# Patient Record
Sex: Male | Born: 1990 | Race: Black or African American | Hispanic: No | Marital: Single | Smoking: Current every day smoker
Health system: Southern US, Community
[De-identification: ages and names within clinical notes are randomized; demographics above are authoritative.]

---

## 2017-04-27 ENCOUNTER — Encounter (HOSPITAL_COMMUNITY): Payer: Self-pay | Admitting: Emergency Medicine

## 2017-04-27 ENCOUNTER — Emergency Department (HOSPITAL_COMMUNITY)
Admission: EM | Admit: 2017-04-27 | Discharge: 2017-04-27 | Disposition: A | Discharge status: Home / Self Care | Payer: Self-pay | Attending: Emergency Medicine | Admitting: Emergency Medicine

## 2017-04-27 ENCOUNTER — Other Ambulatory Visit: Payer: Self-pay

## 2017-04-27 DIAGNOSIS — R21 Rash and other nonspecific skin eruption: Secondary | ICD-10-CM | POA: Insufficient documentation

## 2017-04-27 LAB — RPR: RPR Ser Ql: NONREACTIVE

## 2017-04-27 LAB — HIV ANTIBODY (ROUTINE TESTING W REFLEX): HIV Screen 4th Generation wRfx: NONREACTIVE

## 2017-04-27 NOTE — ED Triage Notes (Signed)
Pt arriving with rash on his penis of unknown source. Pt states he noticed it a couple days ago but the area has since scabbed over.

## 2017-04-27 NOTE — ED Provider Notes (Signed)
[facility]-EMERGENCY DEPT Provider Note   CSN: [id number] Arrival date & time: 04/27/17  0551     History   Chief Complaint Chief Complaint  Patient presents with  . Rash    HPI Jontrell Zain Lankford is a 27 y.o. male.  HPI  27 year old male presents with a rash noted to his penis.  He states he first noticed it a couple days ago but is not exactly sure how long is been there.  At first it might of had a slight burn to it but then otherwise has been painless.  He has not noticed it changing in shape or size.  No dysuria, abdominal pain, or penile discharge.  No testicular pain or swelling.  History reviewed. No pertinent past medical history.  There are no active problems to display for this patient.   History reviewed. No pertinent surgical history.     Home Medications    Prior to Admission medications   Not on File    Family History No family history on file.  Social History Social History   Tobacco Use  . Smoking status: Not on file  Substance Use Topics  . Alcohol use: Not on file  . Drug use: Not on file     Allergies   Patient has no known allergies.   Review of Systems Review of Systems  Gastrointestinal: Negative for abdominal pain.  Genitourinary: Negative for discharge, penile pain, penile swelling, scrotal swelling and testicular pain.  Skin: Positive for rash.  All other systems reviewed and are negative.    Physical Exam Updated Vital Signs BP 118/75 (BP Location: Left Arm)   Pulse (!) 105   Temp 98.6 F (37 C) (Oral)   Resp 18   SpO2 99%   Physical Exam  Constitutional: He is oriented to person, place, and time. He appears well-developed and well-nourished.  HENT:  Head: Normocephalic and atraumatic.  Right Ear: External ear normal.  Left Ear: External ear normal.  Nose: Nose normal.  Eyes: Right eye exhibits no discharge. Left eye exhibits no discharge.  Pulmonary/Chest: Effort normal.    Abdominal: Soft. He exhibits no distension. There is no tenderness.  Genitourinary: Right testis shows no swelling and no tenderness. Left testis shows no swelling and no tenderness. No penile erythema or penile tenderness. No discharge found.     Musculoskeletal: He exhibits no edema.  Neurological: He is alert and oriented to person, place, and time.  Skin: Skin is warm and dry. Rash noted.  Nursing note and vitals reviewed.    ED Treatments / Results  Labs (all labs ordered are listed, but only abnormal results are displayed) Labs Reviewed  RPR  HIV ANTIBODY (ROUTINE TESTING)  GC/CHLAMYDIA PROBE AMP ([place]) NOT AT [facility]    EKG  EKG Interpretation None       Radiology No results found.  Procedures Procedures (including critical care time)  Medications Ordered in ED Medications - No data to display   Initial Impression / Assessment and Plan / ED Course  I have reviewed the triage vital signs and the nursing notes.  Pertinent labs & imaging results that were available during my care of the patient were reviewed by me and considered in my medical decision making (see chart for details).     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  He does not have any other obvious STI symptoms.  He has otherwise not had  any fever or other acute illness.  No discharge.  He will be tested for STI and counseled not to have intercourse until these test results of come back.  HIV, RPR, and GC/chlamydia will be sent.  Otherwise, discussed following up with the health department and that given the lesion is not growing it is okay to wait at this time.  If his symptoms seem to be worsening or lesion is becoming larger, return for evaluation.  Final Clinical Impressions(s) / ED Diagnoses   Final diagnoses:  Penile rash    ED Discharge Orders    None       Pricilla LovelessGoldston, Ramona Slinger, MD 04/27/17 [phone]

## 2017-04-27 NOTE — Discharge Instructions (Signed)
Your are being tested for STDs by blood and urine. These can take 48 hours or more to come back. If you do not hear from us in 72 hours, call for your test results. Otherwise follow up with the health department. Do not have intercourse until you know these results are negative, and even then make sure you use protection

## 2017-04-30 LAB — GC/CHLAMYDIA PROBE AMP (~~LOC~~) NOT AT ARMC
CHLAMYDIA, DNA PROBE: NEGATIVE
NEISSERIA GONORRHEA: NEGATIVE

## 2020-01-07 ENCOUNTER — Emergency Department
Admission: EM | Admit: 2020-01-07 | Discharge: 2020-01-07 | Disposition: A | Discharge status: Home / Self Care | Payer: 59 | Attending: Emergency Medicine | Admitting: Emergency Medicine

## 2020-01-07 ENCOUNTER — Emergency Department: Payer: 59

## 2020-01-07 ENCOUNTER — Other Ambulatory Visit: Payer: Self-pay

## 2020-01-07 ENCOUNTER — Encounter: Payer: Self-pay | Admitting: Emergency Medicine

## 2020-01-07 DIAGNOSIS — R1031 Right lower quadrant pain: Secondary | ICD-10-CM | POA: Diagnosis present

## 2020-01-07 DIAGNOSIS — F17200 Nicotine dependence, unspecified, uncomplicated: Secondary | ICD-10-CM | POA: Insufficient documentation

## 2020-01-07 LAB — COMPREHENSIVE METABOLIC PANEL
ALT: 11 U/L (ref 0–44)
AST: 16 U/L (ref 15–41)
Albumin: 4.3 g/dL (ref 3.5–5.0)
Alkaline Phosphatase: 65 U/L (ref 38–126)
Anion gap: 8 (ref 5–15)
BUN: 13 mg/dL (ref 6–20)
CO2: 27 mmol/L (ref 22–32)
Calcium: 9.6 mg/dL (ref 8.9–10.3)
Chloride: 105 mmol/L (ref 98–111)
Creatinine, Ser: 0.98 mg/dL (ref 0.61–1.24)
GFR, Estimated: >60 mL/min (ref >60)
Glucose, Bld: 115 mg/dL — ABNORMAL HIGH (ref 70–99)
Potassium: 3.4 mmol/L — ABNORMAL LOW (ref 3.5–5.1)
Sodium: 140 mmol/L (ref 135–145)
Total Bilirubin: 1.7 mg/dL — ABNORMAL HIGH (ref 0.3–1.2)
Total Protein: 7.6 g/dL (ref 6.5–8.1)

## 2020-01-07 LAB — URINALYSIS, COMPLETE (UACMP) WITH MICROSCOPIC
Bacteria, UA: NONE SEEN
Bilirubin Urine: NEGATIVE
Glucose, UA: NEGATIVE mg/dL
Ketones, ur: NEGATIVE mg/dL
Leukocytes,Ua: NEGATIVE
Nitrite: NEGATIVE
Protein, ur: NEGATIVE mg/dL
Specific Gravity, Urine: >1.046 — ABNORMAL HIGH (ref 1.005–1.030)
pH: 6 (ref 5.0–8.0)

## 2020-01-07 LAB — CBC
HCT: 37.7 % — ABNORMAL LOW (ref 39.0–52.0)
Hemoglobin: 13.4 g/dL (ref 13.0–17.0)
MCH: 33.8 pg (ref 26.0–34.0)
MCHC: 35.5 g/dL (ref 30.0–36.0)
MCV: 95.2 fL (ref 80.0–100.0)
Platelets: 187 10*3/uL (ref 150–400)
RBC: 3.96 MIL/uL — ABNORMAL LOW (ref 4.22–5.81)
RDW: 11.6 % (ref 11.5–15.5)
WBC: 8.4 10*3/uL (ref 4.0–10.5)
nRBC: 0 % (ref 0.0–0.2)

## 2020-01-07 LAB — HIV ANTIBODY (ROUTINE TESTING W REFLEX): HIV Screen 4th Generation wRfx: NONREACTIVE

## 2020-01-07 LAB — CHLAMYDIA/NGC RT PCR (ARMC ONLY)
Chlamydia Tr: DETECTED — AB
N gonorrhoeae: NOT DETECTED

## 2020-01-07 IMAGING — CT CT ABD-PELV W/ CM
2 of 4 series · 15 of 46 positions shown, 17 images · IV contrast (APPLIED)
Comparison: None.

CLINICAL DATA: Right-sided groin pain with hard tender palpable
knot.

EXAM:
CT ABDOMEN AND PELVIS WITH CONTRAST
TECHNIQUE: Multidetector CT imaging of the abdomen and pelvis was performed
using the standard protocol following bolus administration of
intravenous contrast.
CONTRAST:  75mL OMNIPAQUE IOHEXOL 300 MG/ML  SOLN

[Series 2: routine abd/pel with · axial · 0.64mm/px · z∈[-526,-106]mm · 12 of 96 slices shown, 14 images]
[im 8/96  soft-tissue]
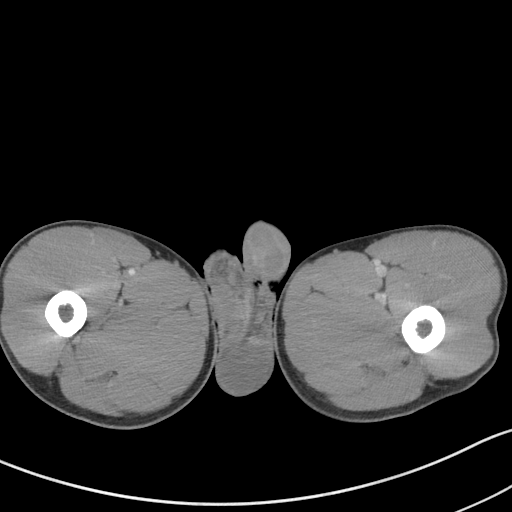
[im 8/96  bone]
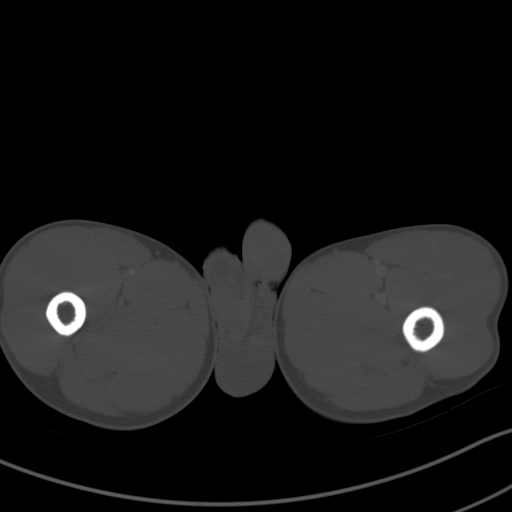
[im 16/96  soft-tissue]
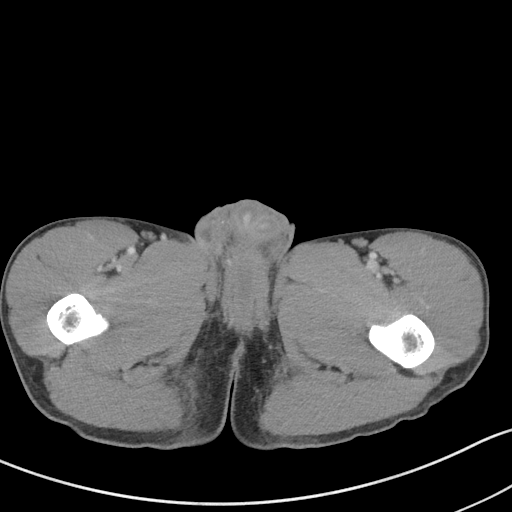
[im 23/96  soft-tissue]
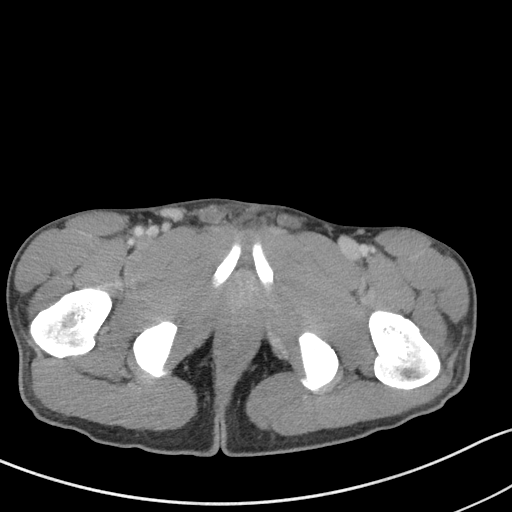
[im 31/96  soft-tissue]
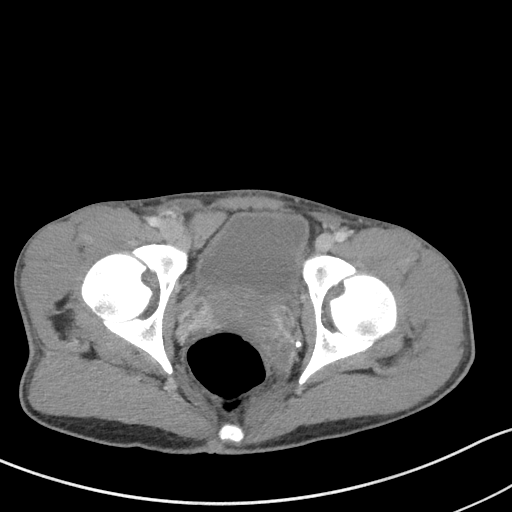
[im 39/96  soft-tissue]
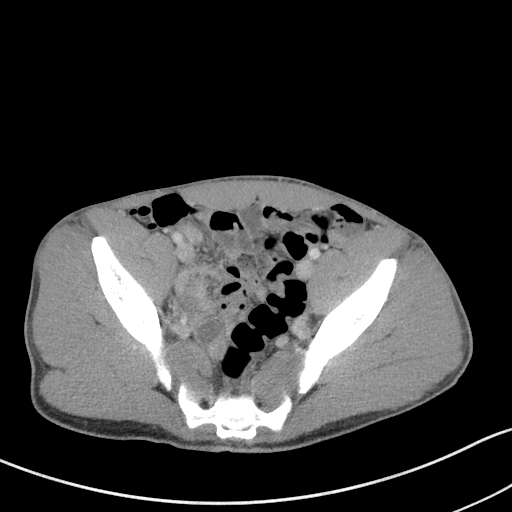
[im 46/96  soft-tissue]
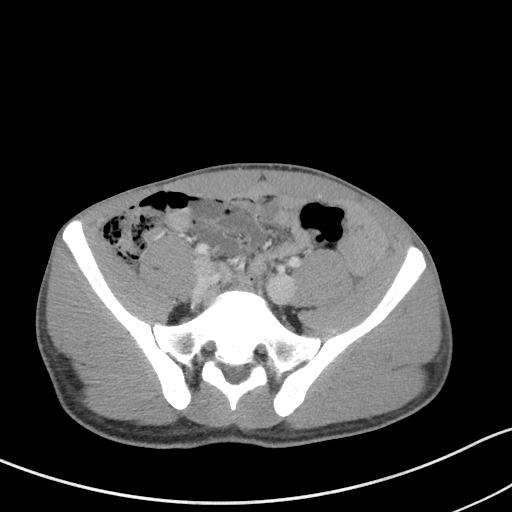
[im 54/96  soft-tissue]
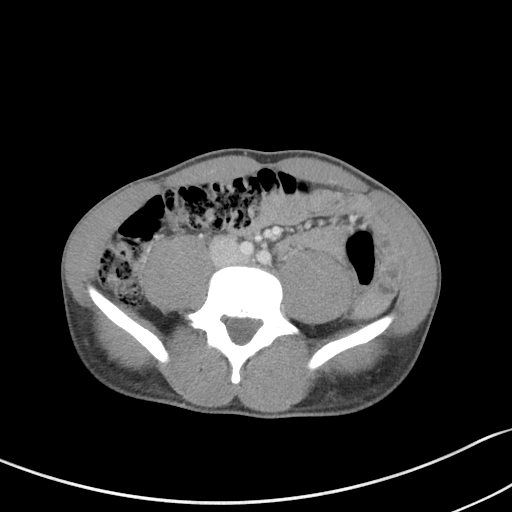
[im 61/96  soft-tissue]
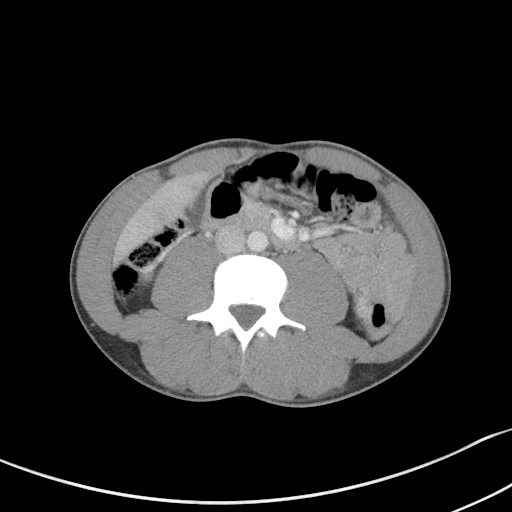
[im 69/96  soft-tissue]
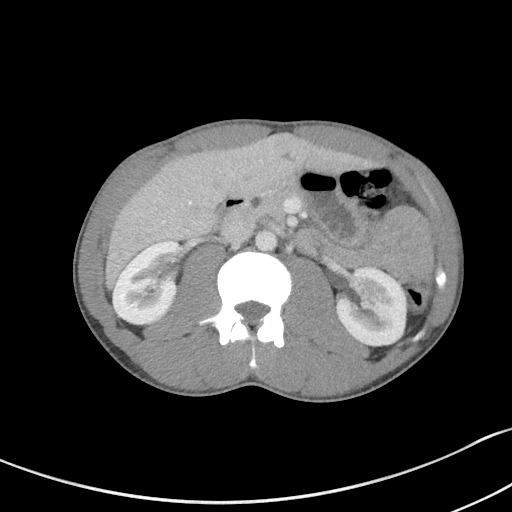
[im 69/96  bone]
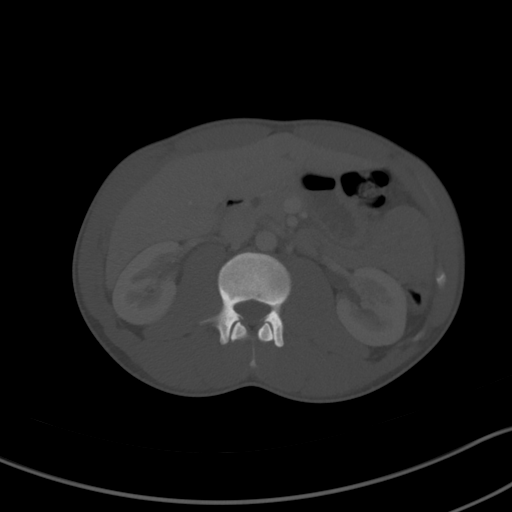
[im 77/96  soft-tissue]
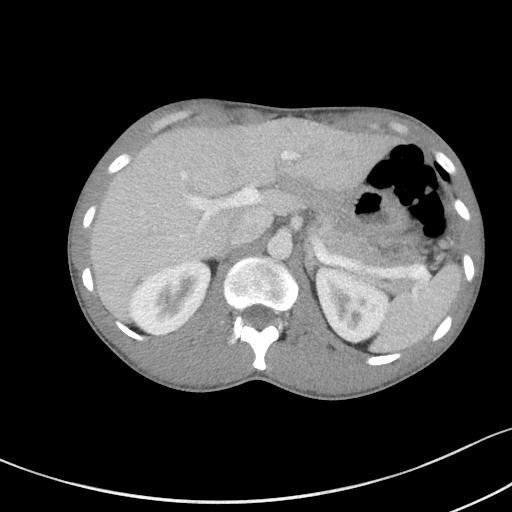
[im 84/96  soft-tissue]
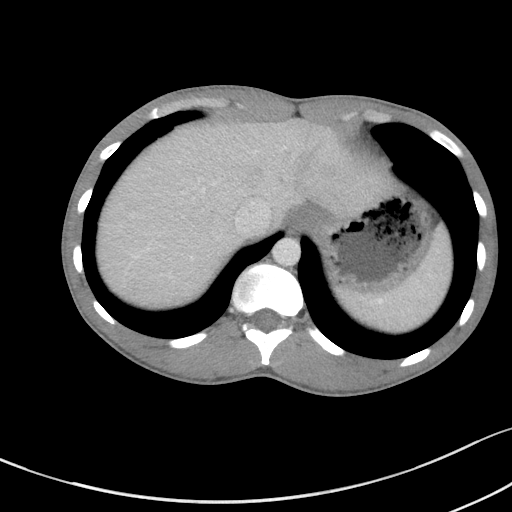
[im 92/96  soft-tissue]
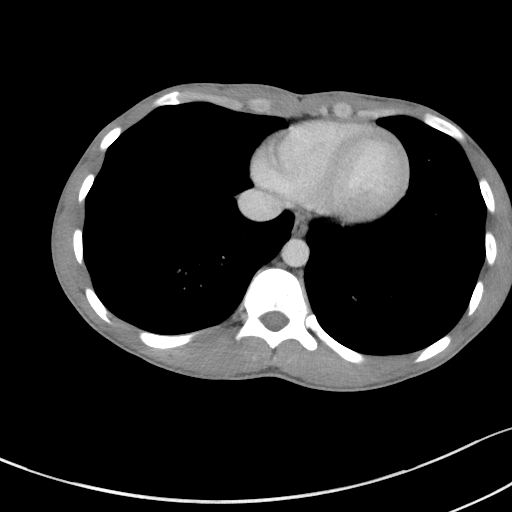

[Series 5: coronal st · coronal · 0.66mm/px · 3 of 81 slices shown]
[im 27/81  soft-tissue]
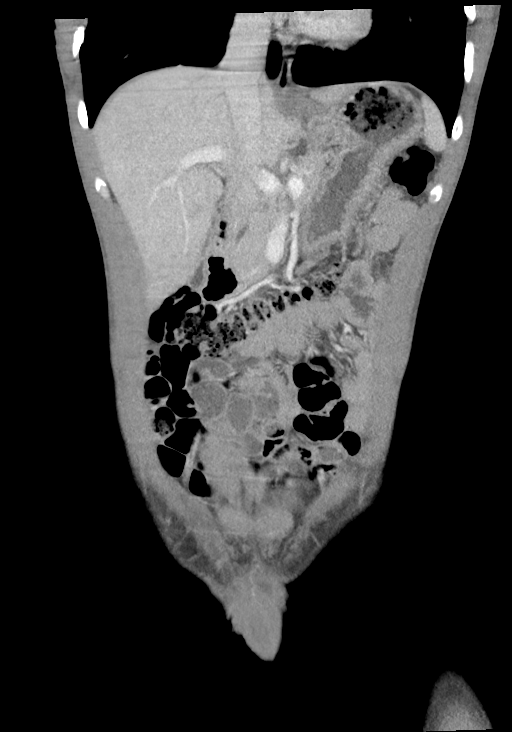
[im 36/81  soft-tissue]
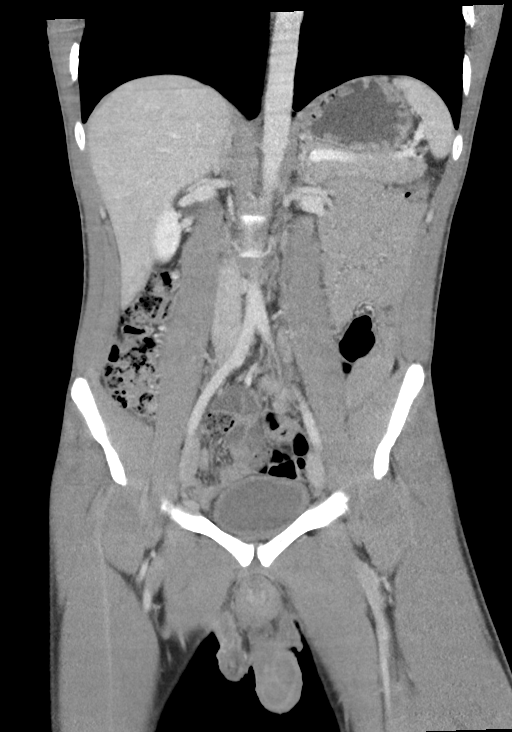
[im 45/81  soft-tissue]
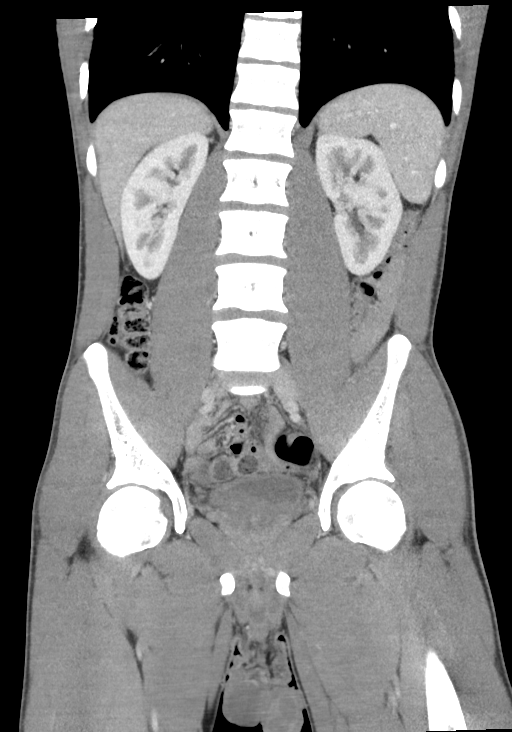

[15 of 46 positions shown; findings below may reference images not displayed]

FINDINGS: Lower chest: Unremarkable.

Hepatobiliary: No suspicious focal abnormality within the liver
parenchyma. Gallbladder is nondistended. No intrahepatic or
extrahepatic biliary dilation.

Pancreas: No focal mass lesion. No dilatation of the main duct. No
intraparenchymal cyst. No peripancreatic edema.

Spleen: No splenomegaly. No focal mass lesion.

Adrenals/Urinary Tract: No adrenal nodule or mass. Kidneys
unremarkable. No evidence for hydroureter. The urinary bladder
appears normal for the degree of distention.

Stomach/Bowel: Stomach is unremarkable. No gastric wall thickening.
No evidence of outlet obstruction. Duodenum is normally positioned
as is the ligament of Treitz. No small bowel wall thickening. No
small bowel dilatation. The terminal ileum is normal. The appendix
is normal. No gross colonic mass. No colonic wall thickening.

Vascular/Lymphatic: No abdominal aortic aneurysm. No abdominal
aortic atherosclerotic calcification. There is no gastrohepatic or
hepatoduodenal ligament lymphadenopathy. No retroperitoneal or
mesenteric lymphadenopathy. No pelvic sidewall lymphadenopathy.

Reproductive: The prostate gland and seminal vesicles are
unremarkable.

Other: No intraperitoneal free fluid.

Musculoskeletal: No evidence for groin hernia. Subtle asymmetry of
groin lymph nodes evident with right-sided groin nodes measuring up
to 5 mm short axis. Subtle asymmetric skin thickening noted in the
right groin region tracking into the right aspect of the scrotum. No
worrisome lytic or sclerotic osseous abnormality.
IMPRESSION: 1. No right groin hernia. No right groin mass. Normal sized lymph
nodes in the right groin region are asymmetrically larger than the
left. There is also subtle asymmetric skin thickening and
subcutaneous edema in the right groin region tracking into the right
aspect of the scrotum. Imaging features suggest
infectious/inflammatory etiology.

## 2020-01-07 MED ORDER — DOXYCYCLINE HYCLATE 100 MG PO CAPS: 100.0000 mg | ORAL_CAPSULE | Freq: Two times a day (BID) | ORAL | 0 refills | Status: AC

## 2020-01-07 MED ORDER — DOXYCYCLINE HYCLATE 100 MG PO TABS
100.0000 mg | ORAL_TABLET | Freq: Once | ORAL | Status: AC
  Administered 2020-01-07: 100 mg via ORAL
  Filled 2020-01-07: qty 1

## 2020-01-07 MED ORDER — IOHEXOL 300 MG/ML  SOLN
75.0000 mL | Freq: Once | INTRAMUSCULAR | Status: AC | PRN
  Administered 2020-01-07: 75 mL via INTRAVENOUS

## 2020-01-07 MED ORDER — FENTANYL CITRATE (PF) 100 MCG/2ML IJ SOLN
50.0000 ug | Freq: Once | INTRAMUSCULAR | Status: AC
  Administered 2020-01-07: 50 ug via INTRAVENOUS
  Filled 2020-01-07: qty 2

## 2020-01-07 MED ORDER — POTASSIUM CHLORIDE CRYS ER 20 MEQ PO TBCR
40.0000 meq | EXTENDED_RELEASE_TABLET | Freq: Once | ORAL | Status: AC
  Administered 2020-01-07: 40 meq via ORAL
  Filled 2020-01-07: qty 2

## 2020-01-07 MED ORDER — OXYCODONE-ACETAMINOPHEN 5-325 MG PO TABS
1.0000 | ORAL_TABLET | Freq: Once | ORAL | Status: AC
  Administered 2020-01-07: 1 via ORAL
  Filled 2020-01-07: qty 1

## 2020-01-07 MED ORDER — KETOROLAC TROMETHAMINE 30 MG/ML IJ SOLN
30.0000 mg | Freq: Once | INTRAMUSCULAR | Status: AC
  Administered 2020-01-07: 30 mg via INTRAVENOUS
  Filled 2020-01-07: qty 1

## 2020-01-07 MED ORDER — CEFTRIAXONE SODIUM 1 G IJ SOLR
500.0000 mg | Freq: Once | INTRAMUSCULAR | Status: AC
  Administered 2020-01-07: 500 mg via INTRAMUSCULAR
  Filled 2020-01-07: qty 10

## 2020-01-07 NOTE — ED Triage Notes (Signed)
Patient ambulatory to triage with steady gait, without difficulty, appears uncomfortable; pt reports awoke Tues am with rt sided groin pain; hard tender knot palpated to area; denies hx of same

## 2020-01-07 NOTE — ED Provider Notes (Signed)
[facility] Emergency Department Provider Note  ____________________________________________   First MD Initiated Contact with Patient 01/07/20 [phone]     (approximate)  I have reviewed the triage vital signs and the nursing notes.   HISTORY  Chief Complaint Groin Swelling   HPI Nicholas Hansen is a 29 y.o. male without significant past medical history who presents for assessment of an area of pain redness and swelling in his right groin that began yesterday.  Denies any fevers, chills, cough, nausea, vomiting, diarrhea, dysuria, penile or scrotal lesions, left groin pain or redness or swelling or prior similar episodes.  He states it is extremely painful and is aggravated by touch.  No clear alleviating factors.  He states he is sexually active and uses protection sometimes.  Denies any significant medical history, acute medications, EtOH or illicit drug use.      History reviewed. No pertinent past medical history.  There are no problems to display for this patient.   History reviewed. No pertinent surgical history.  Prior to Admission medications   Medication Sig Start Date End Date Taking? Authorizing Provider  doxycycline (VIBRAMYCIN) 100 MG capsule Take 1 capsule (100 mg total) by mouth 2 (two) times daily for 14 days. 01/07/20 01/21/20  Gilles Chiquito, MD    Allergies Patient has no known allergies.  No family history on file.  Social History Social History   Tobacco Use  . Smoking status: Current Every Day Smoker  . Smokeless tobacco: Never Used  Substance Use Topics  . Alcohol use: Not on file  . Drug use: Not on file    Review of Systems  Review of Systems  Constitutional: Negative for chills and fever.  HENT: Negative for sore throat.   Eyes: Negative for pain.  Respiratory: Negative for cough and stridor.   Cardiovascular: Negative for chest pain.  Gastrointestinal: Negative for vomiting.  Genitourinary:  Negative for dysuria.  Musculoskeletal: Positive for myalgias ( R groin).  Skin: Negative for rash.  Neurological: Negative for seizures, loss of consciousness and headaches.  Psychiatric/Behavioral: Negative for suicidal ideas.  All other systems reviewed and are negative.     ____________________________________________   PHYSICAL EXAM:  VITAL SIGNS: ED Triage Vitals [01/07/20 0456]  Enc Vitals Group     BP (!) 143/82     Pulse Rate 83     Resp 18     Temp 98.2 F (36.8 C)     Temp Source Oral     SpO2 100 %     Weight 130 lb (59 kg)     Height 5\' 8"  (1.727 m)     Head Circumference      Peak Flow      Pain Score 10     Pain Loc      Pain Edu?      Excl. in GC?    Vitals:   01/07/20 0456 01/07/20 0725  BP: (!) 143/82 (!) 141/99  Pulse: 83 65  Resp: 18 16  Temp: 98.2 F (36.8 C)   SpO2: 100% 100%   Physical Exam Vitals and nursing note reviewed.  Constitutional:      Appearance: He is well-developed.  HENT:     Head: Normocephalic and atraumatic.     Right Ear: External ear normal.     Left Ear: External ear normal.     Nose: Nose normal.     Mouth/Throat:     Mouth: Mucous membranes are moist.  Eyes:  Conjunctiva/sclera: Conjunctivae normal.  Cardiovascular:     Rate and Rhythm: Normal rate and regular rhythm.     Heart sounds: No murmur heard.   Pulmonary:     Effort: Pulmonary effort is normal. No respiratory distress.     Breath sounds: Normal breath sounds.  Abdominal:     Palpations: Abdomen is soft.     Tenderness: There is no abdominal tenderness.  Musculoskeletal:     Cervical back: Neck supple.  Skin:    General: Skin is warm and dry.     Capillary Refill: Capillary refill takes less than 2 seconds.  Neurological:     Mental Status: He is alert and oriented to person, place, and time.  Psychiatric:        Mood and Affect: Mood normal.      Patient has a approximately 1 cm circular area of fluctuance tenderness erythema and  warmth in the right inguinal area.  No scrotal or penile lesions.  Scrotum is nontender and nonenlarged.  Penile shaft is unremarkable.  No discharge from tip of meatus.  Left inguinal area is unremarkable. ____________________________________________   LABS (all labs ordered are listed, but only abnormal results are displayed)  Labs Reviewed  CBC - Abnormal; Notable for the following components:      Result Value   RBC 3.96 (*)    HCT 37.7 (*)    All other components within normal limits  COMPREHENSIVE METABOLIC PANEL - Abnormal; Notable for the following components:   Potassium 3.4 (*)    Glucose, Bld 115 (*)    Total Bilirubin 1.7 (*)    All other components within normal limits  CHLAMYDIA/NGC RT PCR ([facility] ONLY)  URINE CULTURE  URINALYSIS, COMPLETE (UACMP) WITH MICROSCOPIC  RPR  HIV ANTIBODY (ROUTINE TESTING W REFLEX)   ____________________________________________  ____________________________________________  RADIOLOGY   Official radiology report(s): CT ABDOMEN PELVIS W CONTRAST  Result Date: 01/07/2020 CLINICAL DATA:  Right-sided groin pain with hard tender palpable knot. EXAM: CT ABDOMEN AND PELVIS WITH CONTRAST TECHNIQUE: Multidetector CT imaging of the abdomen and pelvis was performed using the standard protocol following bolus administration of intravenous contrast. CONTRAST:  52mL OMNIPAQUE IOHEXOL 300 MG/ML  SOLN COMPARISON:  None. FINDINGS: Lower chest: Unremarkable. Hepatobiliary: No suspicious focal abnormality within the liver parenchyma. Gallbladder is nondistended. No intrahepatic or extrahepatic biliary dilation. Pancreas: No focal mass lesion. No dilatation of the main duct. No intraparenchymal cyst. No peripancreatic edema. Spleen: No splenomegaly. No focal mass lesion. Adrenals/Urinary Tract: No adrenal nodule or mass. Kidneys unremarkable. No evidence for hydroureter. The urinary bladder appears normal for the degree of distention. Stomach/Bowel:  Stomach is unremarkable. No gastric wall thickening. No evidence of outlet obstruction. Duodenum is normally positioned as is the ligament of Treitz. No small bowel wall thickening. No small bowel dilatation. The terminal ileum is normal. The appendix is normal. No gross colonic mass. No colonic wall thickening. Vascular/Lymphatic: No abdominal aortic aneurysm. No abdominal aortic atherosclerotic calcification. There is no gastrohepatic or hepatoduodenal ligament lymphadenopathy. No retroperitoneal or mesenteric lymphadenopathy. No pelvic sidewall lymphadenopathy. Reproductive: The prostate gland and seminal vesicles are unremarkable. Other: No intraperitoneal free fluid. Musculoskeletal: No evidence for groin hernia. Subtle asymmetry of groin lymph nodes evident with right-sided groin nodes measuring up to 5 mm short axis. Subtle asymmetric skin thickening noted in the right groin region tracking into the right aspect of the scrotum. No worrisome lytic or sclerotic osseous abnormality. IMPRESSION: 1. No right groin hernia. No right groin mass. Normal  sized lymph nodes in the right groin region are asymmetrically larger than the left. There is also subtle asymmetric skin thickening and subcutaneous edema in the right groin region tracking into the right aspect of the scrotum. Imaging features suggest infectious/inflammatory etiology. Electronically Signed   By: Kennith Center M.D.   On: 01/07/2020 06:36    ____________________________________________   PROCEDURES  Procedure(s) performed (including Critical Care):  Procedures   ____________________________________________   INITIAL IMPRESSION / ASSESSMENT AND PLAN / ED COURSE        Patient presents for assessment of approximately 2 days of right groin pain redness and swelling with area that is tender and fluctuant on exam.  Patient is afebrile and hemodynamically stable on arrival although slightly hypertensive with systolic of 141/99.  Exam as  above remarkable for some erythema tenderness and edema and warmth in the right groin.  Differential includes but is not limited to inguinal hernia, abscess, necrotic lymph node, cellulitis from folliculitis, and we will related to possible STD.  No findings on CT as noted above of any evidence of hernia or groin mass although there is some lymphadenopathy and edema tracking into the scrotum which is more suggestive of an infectious process.  Given concern for possible STD, GC, HIV, and RPR were sent in the ED patient was empirically treated with Rocephin and a dose of doxycycline.  Also plan to treat with a course of outpatient doxycycline which will also cover for cellulitis.  Given it certainly possible this is a enlarged lymph node I believe it is reasonable to trial of antibiotics at this time and will hold off on I&D at this time.  Patient given below noted medications of analgesia.  Advised patient that his blood pressure was slightly elevated today in addition to following up his STD testing results with his PCP he should have his blood pressure rechecked.  Patient voiced understanding and agreement this plan.  Discharged stable condition.  Strict precautions advised and discussed.  Rx written for doxycycline.        ____________________________________________   FINAL CLINICAL IMPRESSION(S) / ED DIAGNOSES  Final diagnoses:  Right inguinal pain    Medications  fentaNYL (SUBLIMAZE) injection 50 mcg (50 mcg Intravenous Given 01/07/20 0514)  iohexol (OMNIPAQUE) 300 MG/ML solution 75 mL (75 mLs Intravenous Contrast Given 01/07/20 0612)  potassium chloride SA (KLOR-CON) CR tablet 40 mEq (40 mEq Oral Given 01/07/20 0747)  ketorolac (TORADOL) 30 MG/ML injection 30 mg (30 mg Intravenous Given 01/07/20 0751)  cefTRIAXone (ROCEPHIN) injection 500 mg (500 mg Intramuscular Given 01/07/20 0753)  doxycycline (VIBRA-TABS) tablet 100 mg (100 mg Oral Given 01/07/20 0747)  oxyCODONE-acetaminophen  (PERCOCET/ROXICET) 5-325 MG per tablet 1 tablet (1 tablet Oral Given 01/07/20 0747)     ED Discharge Orders         Ordered    doxycycline (VIBRAMYCIN) 100 MG capsule  2 times daily        01/07/20 0820           Note:  This document was prepared using Dragon voice recognition software and may include unintentional dictation errors.   Gilles Chiquito, MD 01/07/20 [phone]

## 2020-01-08 ENCOUNTER — Telehealth: Payer: Self-pay | Admitting: Emergency Medicine

## 2020-01-08 LAB — URINE CULTURE: Culture: NO GROWTH

## 2020-01-08 LAB — RPR: RPR Ser Ql: NONREACTIVE

## 2020-01-08 NOTE — Telephone Encounter (Signed)
called patient to assure he is aware of std result.  No answer and no voicemail available.  I did send an sms message with my number to call back.

## 2020-01-09 NOTE — Telephone Encounter (Signed)
Called patient again as there has not been any response to previous call.  No answer and voicemail is full.  Will send letter.
